# Patient Record
Sex: Male | Born: 1995
Health system: Southern US, Community
[De-identification: ages and names within clinical notes are randomized; demographics above are authoritative.]

## PROBLEM LIST (undated history)

## (undated) DIAGNOSIS — H9190 Unspecified hearing loss, unspecified ear: Secondary | ICD-10-CM

## (undated) DIAGNOSIS — IMO0001 Reserved for inherently not codable concepts without codable children: Secondary | ICD-10-CM

## (undated) HISTORY — PX: APPENDECTOMY: SHX54

---
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
## 2015-06-20 DIAGNOSIS — K529 Noninfective gastroenteritis and colitis, unspecified: Secondary | ICD-10-CM | POA: Diagnosis not present

## 2015-08-13 DIAGNOSIS — W240XXA Contact with lifting devices, not elsewhere classified, initial encounter: Secondary | ICD-10-CM | POA: Diagnosis not present

## 2015-08-13 DIAGNOSIS — H913 Deaf nonspeaking, not elsewhere classified: Secondary | ICD-10-CM | POA: Diagnosis not present

## 2015-08-13 DIAGNOSIS — S63501A Unspecified sprain of right wrist, initial encounter: Secondary | ICD-10-CM | POA: Diagnosis not present

## 2015-08-13 DIAGNOSIS — Y93F2 Activity, caregiving, lifting: Secondary | ICD-10-CM | POA: Diagnosis not present

## 2015-08-13 DIAGNOSIS — Z87891 Personal history of nicotine dependence: Secondary | ICD-10-CM | POA: Diagnosis not present

## 2015-08-13 DIAGNOSIS — S6991XA Unspecified injury of right wrist, hand and finger(s), initial encounter: Secondary | ICD-10-CM | POA: Diagnosis not present

## 2015-08-13 DIAGNOSIS — S66911A Strain of unspecified muscle, fascia and tendon at wrist and hand level, right hand, initial encounter: Secondary | ICD-10-CM | POA: Diagnosis not present

## 2015-10-11 ENCOUNTER — Emergency Department (INDEPENDENT_AMBULATORY_CARE_PROVIDER_SITE_OTHER)
Admission: EM | Admit: 2015-10-11 | Discharge: 2015-10-11 | Disposition: A | Discharge status: Home / Self Care | Payer: Medicare Other | Source: Home / Self Care | Attending: Family Medicine | Admitting: Family Medicine

## 2015-10-11 ENCOUNTER — Encounter: Payer: Self-pay | Admitting: *Deleted

## 2015-10-11 DIAGNOSIS — K0889 Other specified disorders of teeth and supporting structures: Secondary | ICD-10-CM | POA: Diagnosis not present

## 2015-10-11 MED ORDER — AMOXICILLIN-POT CLAVULANATE 875-125 MG PO TABS: 1.0000 | ORAL_TABLET | Freq: Two times a day (BID) | ORAL | Status: DC

## 2015-10-11 MED ORDER — HYDROCODONE-ACETAMINOPHEN 5-325 MG PO TABS: 1.0000 | ORAL_TABLET | Freq: Four times a day (QID) | ORAL | Status: DC | PRN

## 2015-10-11 MED ORDER — ACETAMINOPHEN 325 MG PO TABS
650.0000 mg | ORAL_TABLET | Freq: Once | ORAL | Status: AC
  Administered 2015-10-11: 650 mg via ORAL

## 2015-10-11 NOTE — Discharge Instructions (Signed)
May take Ibuprofen 800mg , 1 tab every 8 hours with food.  If symptoms become significantly worse during the night or over the weekend, proceed to the local emergency room.  Try warm salt water gargles

## 2015-10-11 NOTE — ED Notes (Signed)
Patient is hearing impaired, offered interpretor during triage. Patient agreed he'd like to use one. Leory PlowmanNana Called for interpretor @ 11:10am

## 2015-10-11 NOTE — ED Notes (Signed)
Interpreter used for triage. Pt c/o 4 days of right sided HA, sinus pain, oral swelling and jaw pain. Taken 800mg  IBF without relief.

## 2015-10-11 NOTE — ED Provider Notes (Signed)
CSN: [id number]     Arrival date & time 10/11/15  1054 History   First MD Initiated Contact with Patient 10/11/15 1056     Chief Complaint  Patient presents with  . Headache  . Oral Swelling      Patient is a 19 y.o. male presenting with tooth pain. The history is provided by the patient. The history is limited by a language barrier. A language interpreter was used.  Dental Pain Location:  Lower Lower teeth location:  32/RL 3rd molar Quality:  Aching Severity:  Moderate Onset quality:  Sudden Duration:  4 days Timing:  Constant Progression:  Worsening Chronicity:  New Relieved by:  Nothing Worsened by:  Jaw movement, pressure and cold food/drink Ineffective treatments:  NSAIDs Associated symptoms: gum swelling and headaches   Associated symptoms: no congestion, no difficulty swallowing, no drooling, no facial pain, no facial swelling, no fever, no neck pain, no neck swelling, no oral bleeding, no oral lesions and no trismus     History reviewed. No pertinent past medical history. Past Surgical History  Procedure Laterality Date  . Appendectomy     Family History  Problem Relation Age of Onset  . Adopted: Yes   Social History  Substance Use Topics  . Smoking status: Never Smoker   . Smokeless tobacco: Never Used  . Alcohol Use: No    Review of Systems  Constitutional: Negative for fever.  HENT: Negative for congestion, drooling, facial swelling and mouth sores.   Musculoskeletal: Negative for neck pain.  Neurological: Positive for headaches.  All other systems reviewed and are negative.   Allergies  Review of patient's allergies indicates not on file.  Home Medications   Prior to Admission medications   Medication Sig Start Date End Date Taking? Authorizing Provider  amoxicillin-clavulanate (AUGMENTIN) 875-125 MG tablet Take 1 tablet by mouth 2 (two) times daily. Take with food 10/11/15   Lattie Haw, MD  HYDROcodone-acetaminophen (NORCO/VICODIN) 5-325 MG  tablet Take 1 tablet by mouth every 6 (six) hours as needed for moderate pain. 10/11/15   Lattie Haw, MD   Meds Ordered and Administered this Visit  Medications - No data to display  BP 121/75 mmHg  Pulse 51  Temp(Src) 98 F (36.7 C) (Oral)  Resp 16  Wt 180 lb (81.647 kg)  SpO2 99% No data found.   Physical Exam  Constitutional: He is oriented to person, place, and time. He appears well-developed and well-nourished. No distress.  HENT:  Head: Normocephalic.  Right Ear: External ear normal.  Left Ear: External ear normal.  Mouth/Throat: Uvula is midline and oropharynx is clear and moist. No trismus in the jaw. Abnormal dentition.    Right "wisdom" tooth partly impacted with surrounding gingival swollen and tendern to palpation.  No fluctuance noted.  There is overlying tenderness to palpation of the right cheek  Eyes: Conjunctivae and EOM are normal. Pupils are equal, round, and reactive to light.  Neck: Neck supple.  Cardiovascular: Normal heart sounds.   Pulmonary/Chest: Breath sounds normal.  Lymphadenopathy:    He has no cervical adenopathy.  Neurological: He is alert and oriented to person, place, and time.  Skin: Skin is warm and dry. No rash noted.  Nursing note and vitals reviewed.   ED Course  Procedures  none  MDM   1. Pain, dental    Begin Augmentin.  Rx for Lortab May take Ibuprofen , 1 tab every 8 hours with food.  If symptoms become significantly worse during  the night or over the weekend, proceed to the local emergency room.  Try warm salt water gargles Followup with dentist within one week.    Lattie HawStephen A Yoshiye Kraft, MD 10/17/15 [phone]

## 2015-10-29 ENCOUNTER — Emergency Department (INDEPENDENT_AMBULATORY_CARE_PROVIDER_SITE_OTHER): Payer: Medicare Other

## 2015-10-29 ENCOUNTER — Encounter: Payer: Self-pay | Admitting: Emergency Medicine

## 2015-10-29 ENCOUNTER — Emergency Department (INDEPENDENT_AMBULATORY_CARE_PROVIDER_SITE_OTHER)
Admission: EM | Admit: 2015-10-29 | Discharge: 2015-10-29 | Disposition: A | Discharge status: Home / Self Care | Payer: Medicare Other | Source: Home / Self Care | Attending: Family Medicine | Admitting: Family Medicine

## 2015-10-29 DIAGNOSIS — S60211A Contusion of right wrist, initial encounter: Secondary | ICD-10-CM | POA: Diagnosis not present

## 2015-10-29 DIAGNOSIS — M25531 Pain in right wrist: Secondary | ICD-10-CM

## 2015-10-29 DIAGNOSIS — S6991XA Unspecified injury of right wrist, hand and finger(s), initial encounter: Secondary | ICD-10-CM | POA: Diagnosis not present

## 2015-10-29 HISTORY — DX: Reserved for inherently not codable concepts without codable children: IMO0001

## 2015-10-29 HISTORY — DX: Unspecified hearing loss, unspecified ear: H91.90

## 2015-10-29 MED ORDER — TRAMADOL HCL 50 MG PO TABS: 50.0000 mg | ORAL_TABLET | Freq: Four times a day (QID) | ORAL | Status: DC | PRN

## 2015-10-29 NOTE — ED Provider Notes (Signed)
CSN: [id number]     Arrival date & time 10/29/15  1331 History   First MD Initiated Contact with Patient 10/29/15 1341     Chief Complaint  Patient presents with  . Wrist Pain   (Consider location/radiation/quality/duration/timing/severity/associated sxs/prior Treatment) Patient is a 19 y.o. male presenting with wrist pain. The history is provided by the patient. A language interpreter was used (ASL).  Wrist Pain  Pt is a 19yo male [facility] with [facility]/o persistent Right wrist pain that started about 2 months ago after he injured his wrist lifting a heavy box at work.  [facility] and had imaging done. The x-ray showed no fracture or dislocation, he was given a splint to treat a wrist sprain.  Pt states he has lost the wrist splint and pain has not improved.  Pt states he has continued to lift 50-60 pound boxes at work. No new injuries. He has been taking acetaminophen and ibuprofen with minimal to moderate relief, however, pain is worse at night. Pt is Right hand dominant.  Past Medical History  Diagnosis Date  . Hearing impaired person    Past Surgical History  Procedure Laterality Date  . Appendectomy     Family History  Problem Relation Age of Onset  . Adopted: Yes   Social History  Substance Use Topics  . Smoking status: Never Smoker   . Smokeless tobacco: Never Used  . Alcohol Use: No    Review of Systems  Musculoskeletal: Positive for myalgias and arthralgias. Negative for joint swelling.       Right wrist  Skin: Positive for color change. Negative for pallor, rash and wound.  Neurological: Positive for weakness. Negative for numbness.    Allergies  Review of patient's allergies indicates no known allergies.  Home Medications   Prior to Admission medications   Medication Sig Start Date End Date Taking? Authorizing Provider  amoxicillin-clavulanate (AUGMENTIN) 875-125 MG tablet Take 1 tablet by mouth 2 (two) times daily. Take with  food 10/11/15   Lattie HawStephen A Beese, MD  HYDROcodone-acetaminophen (NORCO/VICODIN) 5-325 MG tablet Take 1 tablet by mouth every 6 (six) hours as needed for moderate pain. 10/11/15   Lattie HawStephen A Beese, MD  traMADol (ULTRAM) 50 MG tablet Take 1 tablet (50 mg total) by mouth every 6 (six) hours as needed. 10/29/15   Junius FinnerErin O'Malley, PA-C   Meds Ordered and Administered this Visit  Medications - No data to display  BP 99/60 mmHg  Pulse 56  Temp(Src) 98.1 F (36.7 C) (Oral)  Resp 16  Ht 5\' 11"  (1.803 m)  Wt 185 lb (83.915 kg)  BMI 25.81 kg/m2  SpO2 95% No data found.   Physical Exam  Constitutional: He is oriented to person, place, and time. He appears well-developed and well-nourished.  HENT:  Head: Normocephalic and atraumatic.  Eyes: EOM are normal.  Neck: Normal range of motion.  Cardiovascular: Normal rate.   Pulses:      Radial pulses are 2+ on the right side.  Right hand: cap refill < 3 seconds  Pulmonary/Chest: Effort normal.  Musculoskeletal: Normal range of motion. He exhibits tenderness. He exhibits no edema.  Right wrist: no edema. Tenderness to dorsal aspect. No deformity noted. Full ROM. Increased pain with full extension and extension against resistance.   Full ROM all fingers. 4/5 grip strength Right hand compared to Left. No snuffbox tenderness.   Neurological: He is alert and oriented to person, place, and time.  Right hand:  normal sensation  Skin: Skin is warm and dry.  Right wrist, dorsal aspect: mild ecchymosis. No erythema or warmth.  Skin in tact.   Psychiatric: He has a normal mood and affect. His behavior is normal.  Nursing note and vitals reviewed.   ED Course  Procedures (including critical care time)  Labs Review Labs Reviewed - No data to display  Imaging Review Dg Wrist Complete Right  10/29/2015  CLINICAL DATA:  Right wrist pain for 3 months. Lifting injury at work. EXAM: RIGHT WRIST - COMPLETE 3+ VIEW COMPARISON:  None. FINDINGS: No acute fracture  or dislocation. Scaphoid intact. Joint spaces maintained. IMPRESSION: No acute osseous abnormality. Electronically Signed   By: Jeronimo Greaves M.D.   On: 10/29/2015 14:28      MDM   1. Right wrist pain   2. Superficial bruising of wrist, right, initial encounter    Pt is a 19yo male c/o 2-3 month hx of Right wrist pain.  Heavy lifting at work.  There is mild ecchymosis at dorsal aspect of wrist where pain is.    Plain films: no bony abnormality  No evidence of underlying infection Pain likely due to an overuse injury, or potential ganglion cyst, however, no nodule/deformity noted on exam  New wrist splint provided to pt. Work note for 7 days of light duty, no lifting >25lbs. F/u with Dr. Denyse Amass, Sports Medicine, for further evaluation and treatment of continued wrist pain. Patient expressed understanding and agreement with treatment plan.  ASL interpreter used throughout entire UC visit.    Junius Finner, PA-C 10/29/15 1529

## 2015-10-29 NOTE — Discharge Instructions (Signed)
Please call to schedule appointment with Dr. Denyse Amassorey, Sports Medicine, for further evaluation and treatment of Right wrist pain.  They may be able to use ultrasound and do joint injections to help with pain, or they may order different imaging such as an MRI to help find the source of the pain.

## 2015-10-29 NOTE — ED Notes (Signed)
Patient interviewed with ASL Interpreter present:  States he injured right wrist in 08-2015; xray was done and no fracture seen; given wrist brace; pain has not resolved and is particularly intense when at work lifting heavy boxes, and again at night when trying to sleep. Takes ibuprofen or tylenol for pain.

## 2015-11-02 ENCOUNTER — Telehealth: Payer: Self-pay | Admitting: Emergency Medicine

## 2016-01-02 ENCOUNTER — Encounter: Payer: Self-pay | Admitting: Emergency Medicine

## 2016-01-02 ENCOUNTER — Emergency Department (INDEPENDENT_AMBULATORY_CARE_PROVIDER_SITE_OTHER)
Admission: EM | Admit: 2016-01-02 | Discharge: 2016-01-02 | Disposition: A | Discharge status: Home / Self Care | Payer: Medicare Other | Source: Home / Self Care | Attending: Family Medicine | Admitting: Family Medicine

## 2016-01-02 DIAGNOSIS — J069 Acute upper respiratory infection, unspecified: Secondary | ICD-10-CM

## 2016-01-02 LAB — POCT RAPID STREP A (OFFICE): Rapid Strep A Screen: NEGATIVE

## 2016-01-02 MED ORDER — FLUTICASONE PROPIONATE 50 MCG/ACT NA SUSP: 2.0000 | Freq: Every day | NASAL | Status: DC

## 2016-01-02 MED ORDER — DOXYCYCLINE HYCLATE 100 MG PO CAPS: 100.0000 mg | ORAL_CAPSULE | Freq: Two times a day (BID) | ORAL | Status: DC

## 2016-01-02 MED ORDER — BENZONATATE 100 MG PO CAPS: 100.0000 mg | ORAL_CAPSULE | Freq: Three times a day (TID) | ORAL | Status: DC

## 2016-01-02 MED ORDER — PREDNISONE 20 MG PO TABS: ORAL_TABLET | ORAL | Status: DC

## 2016-01-02 MED ORDER — PSEUDOEPHEDRINE HCL 60 MG PO TABS: 60.0000 mg | ORAL_TABLET | Freq: Four times a day (QID) | ORAL | Status: DC | PRN

## 2016-01-02 NOTE — ED Notes (Signed)
Sore throat, dry chest, hurts in chest, dry cough, headache, chills, fever x 4 days

## 2016-01-02 NOTE — Discharge Instructions (Signed)
You may take 400-600mg  Ibuprofen (Motrin) every 6-8 hours for fever and pain  Alternate with Tylenol  You may take  Tylenol every 4-6 hours as needed for fever and pain  Follow-up with your primary care provider next week for recheck of symptoms if not improving.  Be sure to drink plenty of fluids and rest, at least 8hrs of sleep a night, preferably more while you are sick. Return urgent care or go to closest ER if you cannot keep down fluids/signs of dehydration, fever not reducing with Tylenol, difficulty breathing/wheezing, stiff neck, worsening condition, or other concerns (see below)   Your symptoms are likely due to a virus such as the common cold, however, if you developing worsening chest congestion with shortness of breath, persistent fever for 3 days, or symptoms not improving in 4-5 days, you may fill the antibiotic (Doxycycline).  If you do fill the antibiotic,  please take antibiotics as prescribed and be sure to complete entire course even if you start to feel better to ensure infection does not come back.   Cool Mist Vaporizers Vaporizers may help relieve the symptoms of a cough and cold. They add moisture to the air, which helps mucus to become thinner and less sticky. This makes it easier to breathe and cough up secretions. Cool mist vaporizers do not cause serious burns like hot mist vaporizers, which may also be called steamers or humidifiers. Vaporizers have not been proven to help with colds. You should not use a vaporizer if you are allergic to mold. HOME CARE INSTRUCTIONS  Follow the package instructions for the vaporizer.  Do not use anything other than distilled water in the vaporizer.  Do not run the vaporizer all of the time. This can cause mold or bacteria to grow in the vaporizer.  Clean the vaporizer after each time it is used.  Clean and dry the vaporizer well before storing it.  Stop using the vaporizer if worsening respiratory symptoms develop.   This  information is not intended to replace advice given to you by your health care provider. Make sure you discuss any questions you have with your health care provider.   Document Released: 07/16/2004 Document Revised: 10/24/2013 Document Reviewed: 03/08/2013 Elsevier Interactive Patient Education 2016 [employer] Inc[employer]  Upper Respiratory Infection, Adult Most upper respiratory infections (URIs) are caused by a virus. A URI affects the nose, throat, and upper air passages. The most common type of URI is often called "the common cold." HOME CARE   Take medicines only as told by your doctor.  Gargle warm saltwater or take cough drops to comfort your throat as told by your doctor.  Use a warm mist humidifier or inhale steam from a shower to increase air moisture. This may make it easier to breathe.  Drink enough fluid to keep your pee (urine) clear or pale yellow.  Eat soups and other clear broths.  Have a healthy diet.  Rest as needed.  Go back to work when your fever is gone or your doctor says it is okay.  You may need to stay home longer to avoid giving your URI to others.  You can also wear a face mask and wash your hands often to prevent spread of the virus.  Use your inhaler more if you have asthma.  Do not use any tobacco products, including cigarettes, chewing tobacco, or electronic cigarettes. If you need help quitting, ask your doctor. GET HELP IF:  You are getting worse, not better.  Your symptoms  are not helped by medicine.  You have chills.  You are getting more short of breath.  You have brown or red mucus.  You have yellow or brown discharge from your nose.  You have pain in your face, especially when you bend forward.  You have a fever.  You have puffy (swollen) neck glands.  You have pain while swallowing.  You have white areas in the back of your throat. GET HELP RIGHT AWAY IF:   You have very bad or constant:  Headache.  Ear pain.  Pain in  your forehead, behind your eyes, and over your cheekbones (sinus pain).  Chest pain.  You have long-lasting (chronic) lung disease and any of the following:  Wheezing.  Long-lasting cough.  Coughing up blood.  A change in your usual mucus.  You have a stiff neck.  You have changes in your:  Vision.  Hearing.  Thinking.  Mood. MAKE SURE YOU:   Understand these instructions.  Will watch your condition.  Will get help right away if you are not doing well or get worse.   This information is not intended to replace advice given to you by your health care provider. Make sure you discuss any questions you have with your health care provider.   Document Released: 04/06/2008 Document Revised: 03/05/2015 Document Reviewed: 01/24/2014 Elsevier Interactive Patient Education [employer]

## 2016-01-02 NOTE — ED Provider Notes (Signed)
CSN: [id number]     Arrival date & time 01/02/16  1122 History   First MD Initiated Contact with Patient 01/02/16 1239     Chief Complaint  Patient presents with  . Sore Throat   (Consider location/radiation/quality/duration/timing/severity/associated sxs/prior Treatment) Patient is a 20 y.o. male presenting with pharyngitis. The history is provided by the patient. The history is limited by a language barrier. A language interpreter was used (ASL interpreter).  Sore Throat Associated symptoms include chest pain and headaches. Pertinent negatives include no abdominal pain and no shortness of breath.   The pt is a 20yo male presenting to [facility] with c/o 4 days of persistent URI symptoms including mild intermittent dry cough, moderately sore throat that is worse with swallowing, sinus congestion, frontal headache and subjective fever with hot and cold chills. He has been taking OTC sinus medication as recommended by some friends but no relief. He has mild nausea but denies vomiting or diarrhea.   Past Medical History  Diagnosis Date  . Hearing impaired person    Past Surgical History  Procedure Laterality Date  . Appendectomy     Family History  Problem Relation Age of Onset  . Adopted: Yes   Social History  Substance Use Topics  . Smoking status: Never Smoker   . Smokeless tobacco: Never Used  . Alcohol Use: Yes    Review of Systems  Constitutional: Positive for fever and chills.  HENT: Positive for congestion. Negative for ear pain, sore throat, trouble swallowing and voice change.   Respiratory: Positive for cough and wheezing. Negative for shortness of breath.   Cardiovascular: Positive for chest pain. Negative for palpitations.  Gastrointestinal: Positive for nausea. Negative for vomiting, abdominal pain and diarrhea.  Musculoskeletal: Positive for myalgias and arthralgias. Negative for back pain.  Skin: Negative for rash.  Neurological: Positive for headaches. Negative for  dizziness and numbness.    Allergies  Review of patient's allergies indicates not on file.  Home Medications   Prior to Admission medications   Medication Sig Start Date End Date Taking? Authorizing Provider  amoxicillin-clavulanate (AUGMENTIN) 875-125 MG tablet Take 1 tablet by mouth 2 (two) times daily. Take with food 10/11/15   Lattie Haw, MD  benzonatate (TESSALON) 100 MG capsule Take 1 capsule (100 mg total) by mouth every 8 (eight) hours. 01/02/16   Junius Finner, PA-C  doxycycline (VIBRAMYCIN) 100 MG capsule Take 1 capsule (100 mg total) by mouth 2 (two) times daily. One po bid x 7 days 01/02/16   Junius Finner, PA-C  fluticasone [facility]) 50 MCG/ACT nasal spray Place 2 sprays into both nostrils daily. For at least 2 weeks, or seasonally as needed 01/02/16   Junius Finner, PA-C  HYDROcodone-acetaminophen (NORCO/VICODIN) 5-325 MG tablet Take 1 tablet by mouth every 6 (six) hours as needed for moderate pain. 10/11/15   Lattie Haw, MD  predniSONE (DELTASONE) 20 MG tablet 3 tabs po day one, then 2 po daily x 4 days 01/02/16   Junius Finner, PA-C  pseudoephedrine (SUDAFED) 60 MG tablet Take 1 tablet (60 mg total) by mouth every 6 (six) hours as needed. 01/02/16   Junius Finner, PA-C  traMADol (ULTRAM) 50 MG tablet Take 1 tablet (50 mg total) by mouth every 6 (six) hours as needed. 10/29/15   Junius Finner, PA-C   Meds Ordered and Administered this Visit  Medications - No data to display  BP 128/75 mmHg  Pulse 62  Temp(Src) 98.1 F (36.7 C) (Oral)  Wt 188 lb (  85.276 kg)  SpO2 100% No data found.   Physical Exam  Constitutional: He appears well-developed and well-nourished.  HENT:  Head: Normocephalic and atraumatic.  Right Ear: Tympanic membrane normal.  Left Ear: Tympanic membrane is scarred. A middle ear effusion is present.  Nose: Mucosal edema and rhinorrhea present. Right sinus exhibits no maxillary sinus tenderness and no frontal sinus tenderness. Left sinus exhibits  maxillary sinus tenderness. Left sinus exhibits no frontal sinus tenderness.  Mouth/Throat: Uvula is midline and mucous membranes are normal. Posterior oropharyngeal erythema present. No oropharyngeal exudate, posterior oropharyngeal edema or tonsillar abscesses.  Eyes: Conjunctivae are normal. No scleral icterus.  Neck: Normal range of motion. Neck supple.  Cardiovascular: Normal rate, regular rhythm and normal heart sounds.   Pulmonary/Chest: Effort normal and breath sounds normal. No stridor. No respiratory distress. He has no wheezes. He has no rales.  Abdominal: Soft. He exhibits no distension. There is no tenderness.  Musculoskeletal: Normal range of motion.  Lymphadenopathy:    He has no cervical adenopathy.  Neurological: He is alert.  Skin: Skin is warm and dry.  Nursing note and vitals reviewed.   ED Course  Procedures (including critical care time)  Labs Review Labs Reviewed  POCT RAPID STREP A (OFFICE)    Imaging Review No results found.     MDM   1. Acute upper respiratory infection    Pt c/o 4 days of URI symptoms.  Pt is out of window for benefit from Tamiflu, no flu test performed.  Rapid strep: negative  Symptoms likely viral. Encouraged symptomatic treatment. Rx: Tessalon, Flonase, and prednisone. May alternate acetaminophen and ibuprofen for fever and pain.  Prescription for Azithromycin provided to pt to hold (expiration date) may fill if symptoms not improving in 4-5 days or persistent fever develops.  F/u with PCP in 7-10 days if not improving.  Patient verbalized understanding and agreement with treatment plan.    Junius Finner, PA-C 01/02/16 1319

## 2016-04-12 DIAGNOSIS — S199XXA Unspecified injury of neck, initial encounter: Secondary | ICD-10-CM | POA: Diagnosis not present

## 2016-04-12 DIAGNOSIS — S0083XA Contusion of other part of head, initial encounter: Secondary | ICD-10-CM | POA: Diagnosis not present

## 2016-04-12 DIAGNOSIS — S069X9A Unspecified intracranial injury with loss of consciousness of unspecified duration, initial encounter: Secondary | ICD-10-CM | POA: Diagnosis not present

## 2016-07-15 ENCOUNTER — Emergency Department (INDEPENDENT_AMBULATORY_CARE_PROVIDER_SITE_OTHER)
Admission: EM | Admit: 2016-07-15 | Discharge: 2016-07-15 | Disposition: A | Discharge status: Home / Self Care | Payer: Medicare Other | Source: Home / Self Care | Attending: Family Medicine | Admitting: Family Medicine

## 2016-07-15 ENCOUNTER — Encounter: Payer: Self-pay | Admitting: Emergency Medicine

## 2016-07-15 DIAGNOSIS — R05 Cough: Secondary | ICD-10-CM

## 2016-07-15 DIAGNOSIS — R059 Cough, unspecified: Secondary | ICD-10-CM

## 2016-07-15 DIAGNOSIS — J069 Acute upper respiratory infection, unspecified: Secondary | ICD-10-CM | POA: Diagnosis not present

## 2016-07-15 LAB — POCT INFLUENZA A/B
Influenza A, POC: NEGATIVE
Influenza B, POC: NEGATIVE

## 2016-07-15 MED ORDER — BENZONATATE 100 MG PO CAPS: 100.0000 mg | ORAL_CAPSULE | Freq: Three times a day (TID) | ORAL | 0 refills | Status: DC

## 2016-07-15 MED ORDER — AZITHROMYCIN 250 MG PO TABS: 250.0000 mg | ORAL_TABLET | Freq: Every day | ORAL | 0 refills | Status: DC

## 2016-07-15 NOTE — ED Triage Notes (Signed)
Headache, runny nose, sore throat, body aches x 2 days

## 2016-07-15 NOTE — Discharge Instructions (Signed)
You may take 400-600mg Ibuprofen (Motrin) every 6-8 hours for fever and pain  °Alternate with Tylenol  °You may take 500mg Tylenol every 4-6 hours as needed for fever and pain  °Follow-up with your primary care provider next week for recheck of symptoms if not improving.  °Be sure to drink plenty of fluids and rest, at least 8hrs of sleep a night, preferably more while you are sick. °Return urgent care or go to closest ER if you cannot keep down fluids/signs of dehydration, fever not reducing with Tylenol, difficulty breathing/wheezing, stiff neck, worsening condition, or other concerns (see below)  ° °Your symptoms are likely due to a virus such as the common cold, however, if you developing worsening chest congestion with shortness of breath, persistent fever for 3 days, or symptoms not improving in 4-5 days, you may fill the antibiotic (azithromycin).  If you do fill the antibiotic,  please take antibiotics as prescribed and be sure to complete entire course even if you start to feel better to ensure infection does not come back. ° °

## 2016-07-15 NOTE — ED Provider Notes (Signed)
CSN: [id number]     Arrival date & time 07/15/16  1038 History   First MD Initiated Contact with Patient 07/15/16 1103     Chief Complaint  Patient presents with  . URI   (Consider location/radiation/quality/duration/timing/severity/associated sxs/prior Treatment) HPI:  Pt is hearing impaired but able to read lips. Declined official Chemical engineer.    Pt c/o 2 days of gradually worsening body aches, chills, nasal congestion, sore throat and headache. Mild intermittent cough.  He has taken OTC "pain reliever" with mild relief.  Subjective fever. Denies n/v/d. No sick contacts or recent travel. Denies chest pain or SOB. No hx of asthma.   Past Medical History:  Diagnosis Date  . Hearing impaired person    Past Surgical History:  Procedure Laterality Date  . APPENDECTOMY     Family History  Problem Relation Age of Onset  . Adopted: Yes   Social History  Substance Use Topics  . Smoking status: Current Every Day Smoker  . Smokeless tobacco: Never Used  . Alcohol use Yes    Review of Systems  Constitutional: Positive for fatigue and fever. Negative for appetite change.  HENT: Positive for congestion, postnasal drip, rhinorrhea, sinus pressure and sore throat. Negative for ear pain and sneezing.   Respiratory: Positive for cough. Negative for chest tightness, shortness of breath and wheezing.   Cardiovascular: Negative for chest pain and palpitations.  Gastrointestinal: Negative for abdominal pain, diarrhea, nausea and vomiting.  Musculoskeletal: Positive for arthralgias and myalgias.  Neurological: Positive for headaches. Negative for dizziness and light-headedness.    Allergies  Review of patient's allergies indicates no known allergies.  Home Medications   Prior to Admission medications   Medication Sig Start Date End Date Taking? Authorizing Provider  azithromycin (ZITHROMAX) 250 MG tablet Take 1 tablet (250 mg total) by mouth daily. Take first 2 tablets together, then  1 every day until finished. 07/15/16   Junius Finner, PA-C  benzonatate (TESSALON) 100 MG capsule Take 1-2 capsules (100-200 mg total) by mouth every 8 (eight) hours. 07/15/16   Junius Finner, PA-C  pseudoephedrine (SUDAFED) 60 MG tablet Take 1 tablet (60 mg total) by mouth every 6 (six) hours as needed. 01/02/16   Junius Finner, PA-C   Meds Ordered and Administered this Visit  Medications - No data to display  BP 118/80 (BP Location: Left Arm)   Pulse (!) 59   Temp 98.3 F (36.8 C) (Oral)   Ht 6' (1.829 m)   Wt 185 lb (83.9 kg)   SpO2 99%   BMI 25.09 kg/m  No data found.   Physical Exam  Constitutional: He appears well-developed and well-nourished. No distress.  HENT:  Head: Normocephalic and atraumatic.  Right Ear: Tympanic membrane normal.  Left Ear: Tympanic membrane normal.  Nose: Rhinorrhea present.  Mouth/Throat: Uvula is midline and mucous membranes are normal. Posterior oropharyngeal erythema present. No oropharyngeal exudate, posterior oropharyngeal edema or tonsillar abscesses.  Eyes: Conjunctivae are normal. No scleral icterus.  Neck: Normal range of motion. Neck supple.  Cardiovascular: Normal rate, regular rhythm and normal heart sounds.   Pulmonary/Chest: Effort normal and breath sounds normal. No respiratory distress. He has no wheezes. He has no rales. He exhibits no tenderness.  Abdominal: Soft. He exhibits no distension. There is no tenderness.  Musculoskeletal: Normal range of motion.  Neurological: He is alert.  Skin: Skin is warm and dry. He is not diaphoretic.  Nursing note and vitals reviewed.   Urgent Care Course   Clinical Course  Procedures (including critical care time)  Labs Review Labs Reviewed  POCT INFLUENZA A/B    Imaging Review No results found.    MDM   1. Acute upper respiratory infection   2. Cough    Pt c/o URI symptoms with body aches for 2 days.  Mild relief with OTC pain reliever.  Flu test- Negative  Symptoms  likely viral. Prescription for Tessalon provided. Prescription to hold with expiration date also provided to fill if symptoms not improving in 1 week or persistent fever develops.    Junius Finnerrin O'Malley, PA-C 07/15/16 1218

## 2016-08-02 ENCOUNTER — Emergency Department (INDEPENDENT_AMBULATORY_CARE_PROVIDER_SITE_OTHER)
Admission: EM | Admit: 2016-08-02 | Discharge: 2016-08-02 | Disposition: A | Discharge status: Home / Self Care | Payer: Medicare Other | Source: Home / Self Care | Attending: Family Medicine | Admitting: Family Medicine

## 2016-08-02 ENCOUNTER — Encounter: Payer: Self-pay | Admitting: Emergency Medicine

## 2016-08-02 DIAGNOSIS — J029 Acute pharyngitis, unspecified: Secondary | ICD-10-CM | POA: Diagnosis not present

## 2016-08-02 LAB — POCT CBC W AUTO DIFF (K'VILLE URGENT CARE)

## 2016-08-02 LAB — POCT MONO SCREEN (KUC): Mono, POC: NEGATIVE

## 2016-08-02 LAB — POCT RAPID STREP A (OFFICE): Rapid Strep A Screen: NEGATIVE

## 2016-08-02 MED ORDER — PENICILLIN V POTASSIUM 500 MG PO TABS: ORAL_TABLET | ORAL | 0 refills | Status: DC

## 2016-08-02 MED ORDER — PREDNISONE 20 MG PO TABS: 20.0000 mg | ORAL_TABLET | Freq: Two times a day (BID) | ORAL | 0 refills | Status: DC

## 2016-08-02 MED ORDER — IBUPROFEN 600 MG PO TABS
600.0000 mg | ORAL_TABLET | Freq: Once | ORAL | Status: AC
  Administered 2016-08-02: 600 mg via ORAL

## 2016-08-02 NOTE — Discharge Instructions (Addendum)
Try warm salt water gargles for sore throat.  May take Tylenol as needed for fever, headache, sore throat, etc.  °

## 2016-08-02 NOTE — ED Triage Notes (Signed)
Patient reports sore throat, body aches and extreme fatigue for past 3 days. No OTC today. Will use interpreter for detailed visit with Dr.Beese (ASL).

## 2016-08-03 NOTE — ED Provider Notes (Signed)
[facility]    CSN: [id number] Arrival date & time: 08/02/16  1650     History   Chief Complaint Chief Complaint  Patient presents with  . Sore Throat  . Generalized Body Aches  . Code Stroke    HPI Frank Ball is a 20 y.o. male.   Interview facilitated by sign language interpretor. Patient complains of onset of fatigue and myalgias four days ago.  He had an episode of diarrhea the next day, now resolved.  During the past two days he has developed a sore throat, the worst he has ever had.  He notes that he is able to swallow fluids.  He has had chills and headache, and remains extremely fatigued.   The history is provided by the patient. The history is limited by a language barrier. A language interpreter was used.    Past Medical History:  Diagnosis Date  . Hearing impaired person     There are no active problems to display for this patient.   Past Surgical History:  Procedure Laterality Date  . APPENDECTOMY         Home Medications    Prior to Admission medications   Medication Sig Start Date End Date Taking? Authorizing Provider  azithromycin (ZITHROMAX) 250 MG tablet Take 1 tablet (250 mg total) by mouth daily. Take first 2 tablets together, then 1 every day until finished. 07/15/16   Junius FinnerErin O'Malley, PA-C  benzonatate (TESSALON) 100 MG capsule Take 1-2 capsules (100-200 mg total) by mouth every 8 (eight) hours. 07/15/16   Junius FinnerErin O'Malley, PA-C  penicillin v potassium (VEETID) 500 MG tablet Take one tab by mouth twice daily for 10 days 08/02/16   Lattie HawStephen A Adarian Bur, MD  predniSONE (DELTASONE) 20 MG tablet Take 1 tablet (20 mg total) by mouth 2 (two) times daily. Take with food. 08/02/16   Lattie HawStephen A Gretel Cantu, MD  pseudoephedrine (SUDAFED) 60 MG tablet Take 1 tablet (60 mg total) by mouth every 6 (six) hours as needed. 01/02/16   Junius FinnerErin O'Malley, PA-C    Family History Family History  Problem Relation Age of Onset  . Adopted: Yes    Social History Social  History  Substance Use Topics  . Smoking status: Current Every Day Smoker  . Smokeless tobacco: Never Used  . Alcohol use Yes     Allergies   Review of patient's allergies indicates no known allergies.   Review of Systems Review of Systems + sore throat No cough No pleuritic pain No wheezing No nasal congestion No post-nasal drainage No sinus pain/pressure No itchy/red eyes No earache No hemoptysis No SOB ? fever, + chills + nausea + vomiting No abdominal pain + diarrhea No urinary symptoms No skin rash + fatigue + myalgias + headache Used OTC meds without relief   Physical Exam Triage Vital Signs ED Triage Vitals  Enc Vitals Group     BP 08/02/16 1817 113/72     Pulse Rate 08/02/16 1817 74     Resp 08/02/16 1817 16     Temp 08/02/16 1817 98.7 F (37.1 C)     Temp Source 08/02/16 1817 Oral     SpO2 08/02/16 1817 100 %     Weight 08/02/16 1818 180 lb (81.6 kg)     Height 08/02/16 1818 6' (1.829 m)     Head Circumference --      Peak Flow --      Pain Score 08/02/16 1820 2     Pain Loc --  Pain Edu? --      Excl. in GC? --    No data found.   Updated Vital Signs BP 113/72 (BP Location: Left Arm)   Pulse 74   Temp 98.7 F (37.1 C) (Oral)   Resp 16   Ht 6' (1.829 m)   Wt 180 lb (81.6 kg)   SpO2 100%   BMI 24.41 kg/m   Visual Acuity Right Eye Distance:   Left Eye Distance:   Bilateral Distance:    Right Eye Near:   Left Eye Near:    Bilateral Near:     Physical Exam Nursing notes and Vital Signs reviewed. Appearance:  Patient appears stated age, and in no acute distress Eyes:  Pupils are equal, round, and reactive to light and accomodation.  Extraocular movement is intact.  Conjunctivae are not inflamed  Ears:  Canals normal.  Tympanic membranes normal.  Nose:  Mildly congested turbinates.  No sinus tenderness.  Pharynx:  Erythematous bilaterally, including uvula; small amount of exudate present.  No trismus Neck:  Supple.  Tender  enlarged posterior/lateral nodes are palpated bilaterally, including tonsillar nodes.  Supraclavicular nodes are tender.  Lungs:  Clear to auscultation.  Breath sounds are equal.  Moving air well. Heart:  Regular rate and rhythm without murmurs, rubs, or gallops.  Abdomen:  Tenderness over both spleen and liver without masses or hepatosplenomegaly.  Bowel sounds are present.  No CVA or flank tenderness.  Extremities:  No edema.  Skin:  No rash present.    UC Treatments / Results  Labs (all labs ordered are listed, but only abnormal results are displayed) Labs Reviewed  STREP A DNA PROBE  EPSTEIN-BARR VIRUS EARLY D ANTIGEN ANTIBODY, IGG  EPSTEIN-BARR VIRUS NUCLEAR ANTIGEN ANTIBODY, IGG  EPSTEIN-BARR VIRUS VCA, IGG  EPSTEIN-BARR VIRUS VCA, IGM  POCT RAPID STREP A (OFFICE) negative POCT CBC:  WBC 10.0; LY 13.1; MO 5.4; GR 81.5; Hgb 16.0; Platelets 177     EKG  EKG Interpretation None       Radiology No results found.  Procedures Procedures (including critical care time)  Medications Ordered in UC Medications  ibuprofen (ADVIL,MOTRIN) tablet 600 mg (600 mg Oral Given 08/02/16 1849)     Initial Impression / Assessment and Plan / UC Course  I have reviewed the triage vital signs and the nursing notes.  Pertinent labs & imaging results that were available during my care of the patient were reviewed by me and considered in my medical decision making (see chart for details).  Clinical Course  ?false negative rapid strep test.   ?mono Throat culture pending.  EBV titers pending. Begin empiric Pen VK, and prednisone burst. Try warm salt water gargles for sore throat.  May take Tylenol as needed for fever, headache, sore throat, etc.  Return if not improved one week (does not have PCP)    Final Clinical Impressions(s) / UC Diagnoses   Final diagnoses:  Acute pharyngitis, unspecified etiology    New Prescriptions Discharge Medication List as of 08/02/2016  7:14 PM      START taking these medications   Details  penicillin v potassium (VEETID) 500 MG tablet Take one tab by mouth twice daily for 10 days, Print    predniSONE (DELTASONE) 20 MG tablet Take 1 tablet (20 mg total) by mouth 2 (two) times daily. Take with food., Starting Sun 08/02/2016, Print         Lattie Haw, MD 08/03/16 1640

## 2016-08-04 LAB — STREP A DNA PROBE: GASP: NOT DETECTED

## 2016-08-04 LAB — EPSTEIN-BARR VIRUS NUCLEAR ANTIGEN ANTIBODY, IGG

## 2016-08-04 LAB — EPSTEIN-BARR VIRUS VCA, IGG: EBV VCA IgG: 219 U/mL — ABNORMAL HIGH

## 2016-08-04 LAB — EPSTEIN-BARR VIRUS VCA, IGM: EBV VCA IgM: <36 U/mL

## 2016-08-04 LAB — EPSTEIN-BARR VIRUS EARLY D ANTIGEN ANTIBODY, IGG: EBV EA IgG: <9 U/mL

## 2016-08-05 ENCOUNTER — Telehealth: Payer: Self-pay | Admitting: Emergency Medicine

## 2016-08-05 ENCOUNTER — Telehealth (INDEPENDENT_AMBULATORY_CARE_PROVIDER_SITE_OTHER): Payer: Medicare Other | Admitting: *Deleted

## 2016-08-05 DIAGNOSIS — J029 Acute pharyngitis, unspecified: Secondary | ICD-10-CM | POA: Diagnosis not present

## 2016-08-05 NOTE — Telephone Encounter (Signed)
Encounter created to enter POCT mono order and result not entered on DOS.

## 2016-08-05 NOTE — Telephone Encounter (Signed)
Text reply from patient stating he is somewhat better. Given lab report results per Dr.Beese.

## 2016-08-06 DIAGNOSIS — J029 Acute pharyngitis, unspecified: Secondary | ICD-10-CM | POA: Diagnosis not present

## 2016-08-06 DIAGNOSIS — Z87891 Personal history of nicotine dependence: Secondary | ICD-10-CM | POA: Diagnosis not present

## 2016-08-06 DIAGNOSIS — R05 Cough: Secondary | ICD-10-CM | POA: Diagnosis not present

## 2016-08-06 NOTE — Telephone Encounter (Signed)
Added CBC order as well

## 2017-07-21 DIAGNOSIS — R300 Dysuria: Secondary | ICD-10-CM | POA: Diagnosis not present

## 2017-07-21 DIAGNOSIS — F17210 Nicotine dependence, cigarettes, uncomplicated: Secondary | ICD-10-CM | POA: Diagnosis not present

## 2017-07-21 DIAGNOSIS — N390 Urinary tract infection, site not specified: Secondary | ICD-10-CM | POA: Diagnosis not present

## 2017-07-28 IMAGING — CR DG WRIST COMPLETE 3+V*R*
4 series · 4 of 4 positions shown · non-contrast
Comparison: None.

CLINICAL DATA: Right wrist pain for 3 months. Lifting injury at
work.

EXAM:
RIGHT WRIST - COMPLETE 3+ VIEW

[wrist pa]
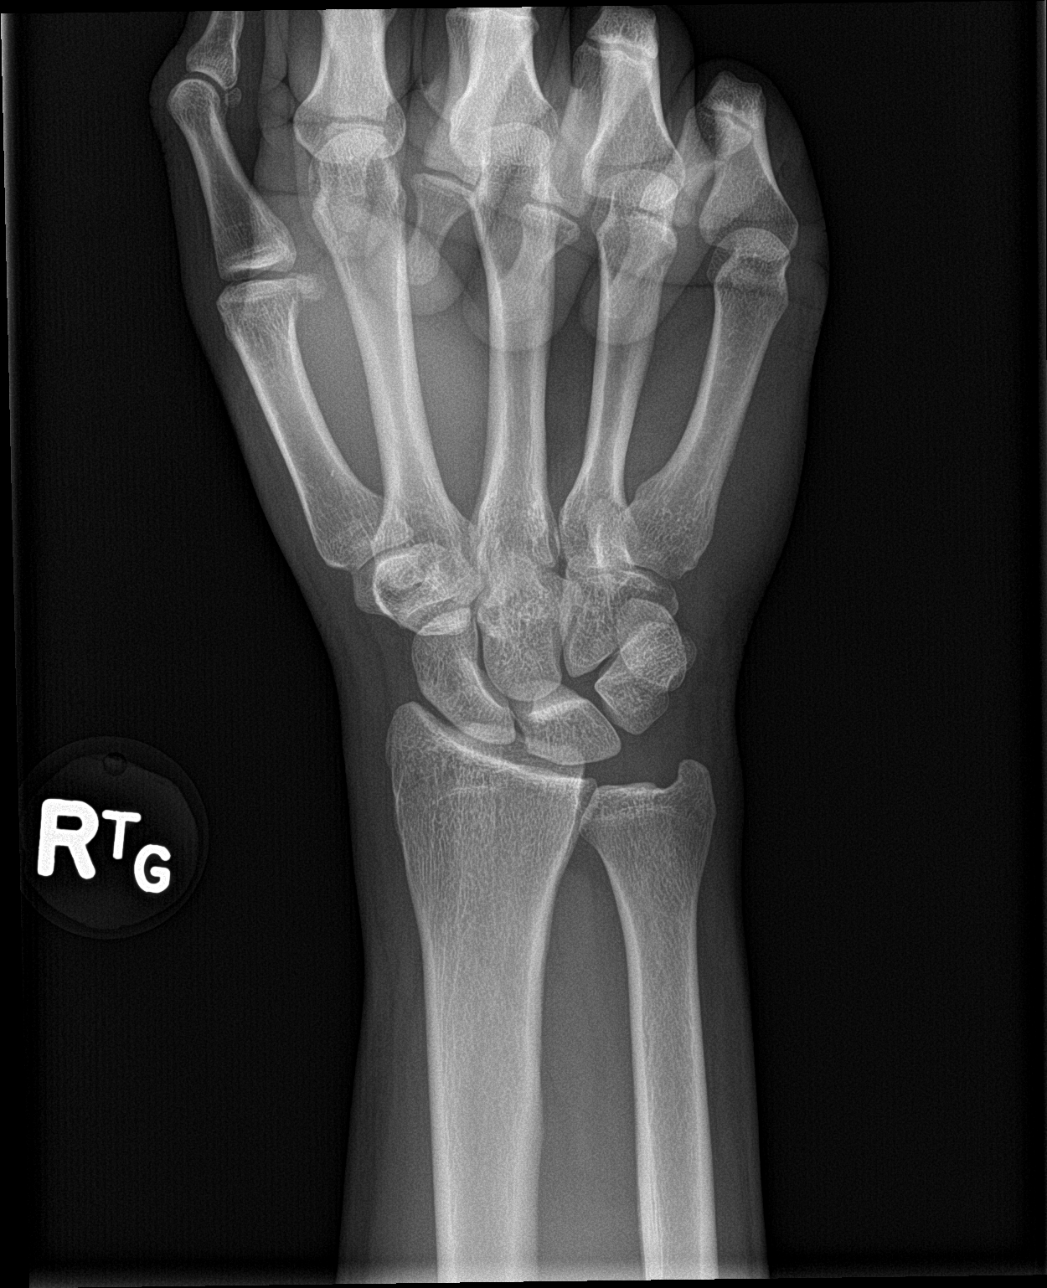

[wrist obl]
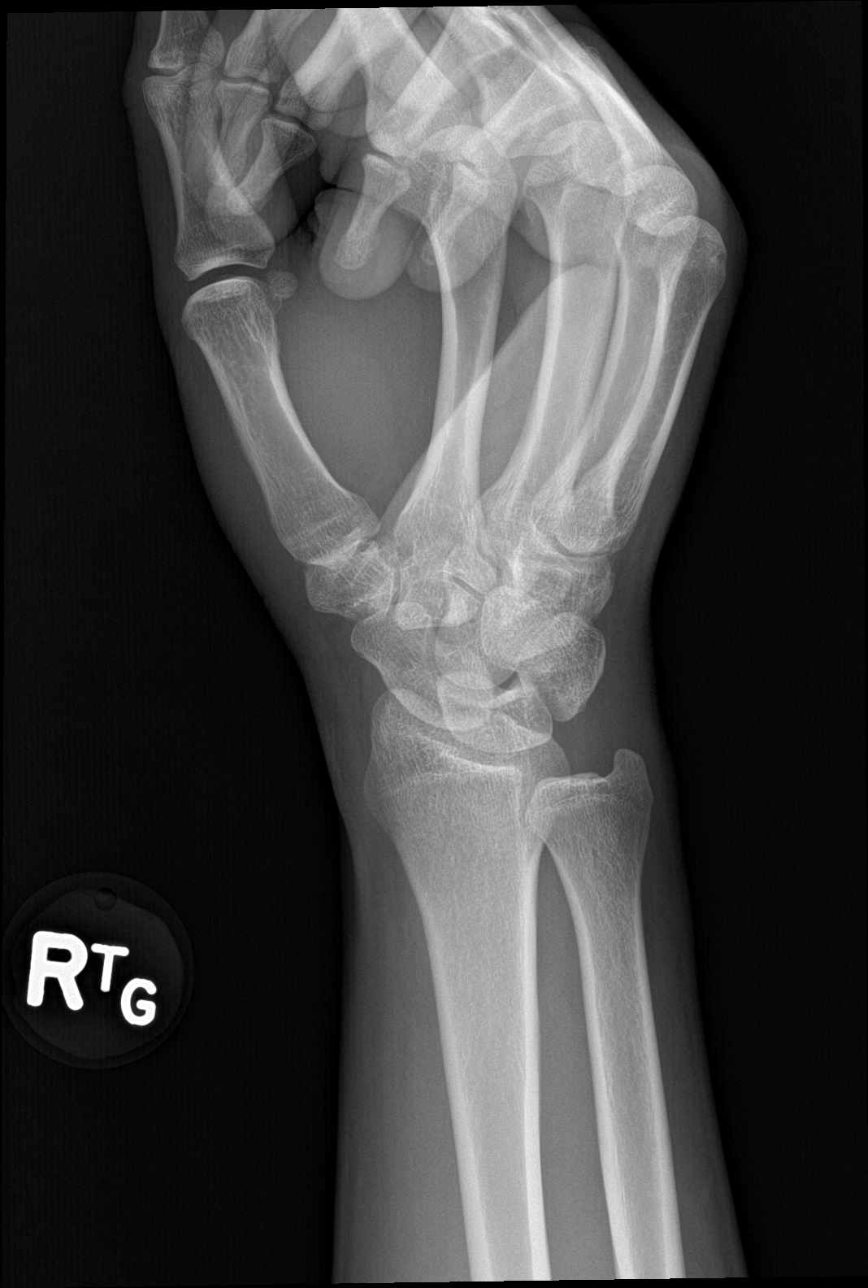

[wrist lat]
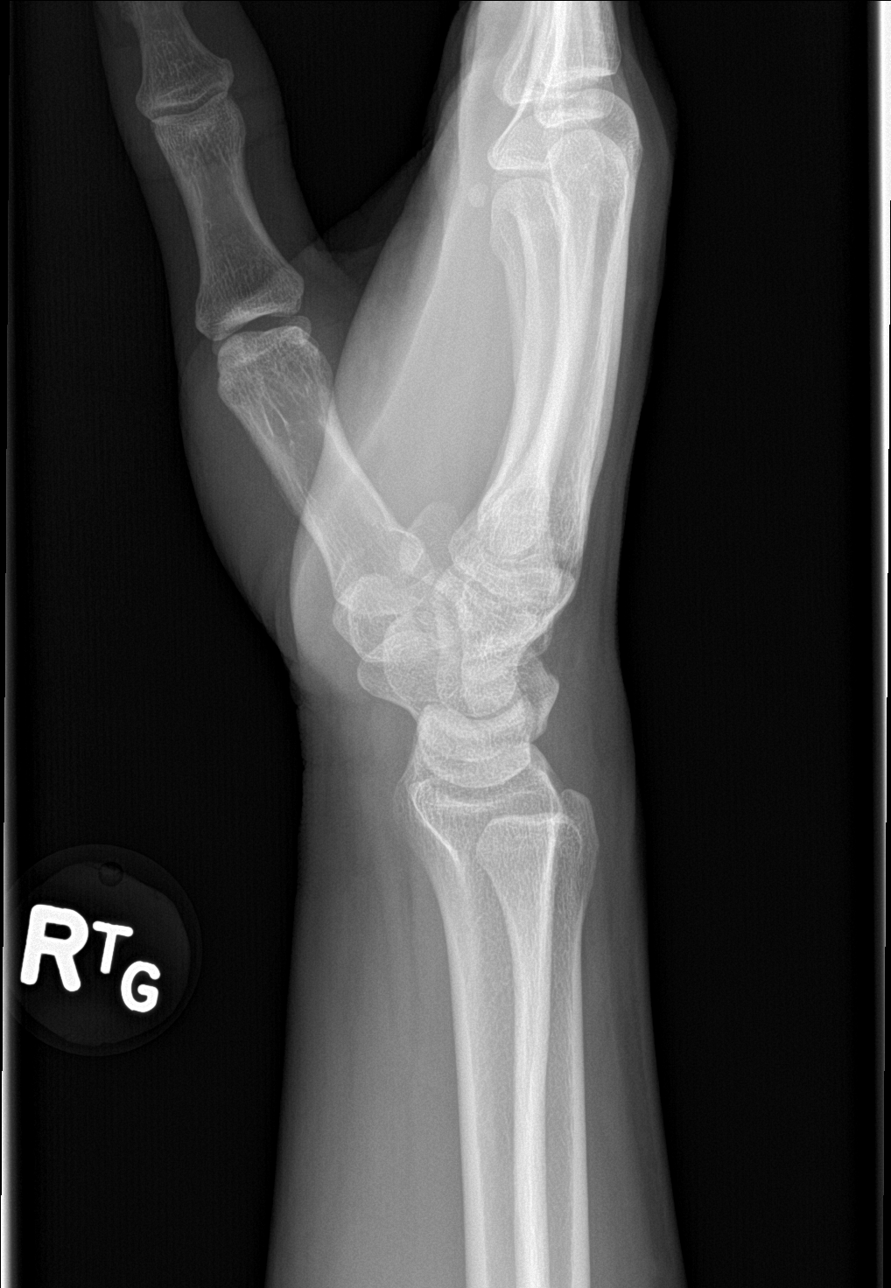

[wrist navicular]
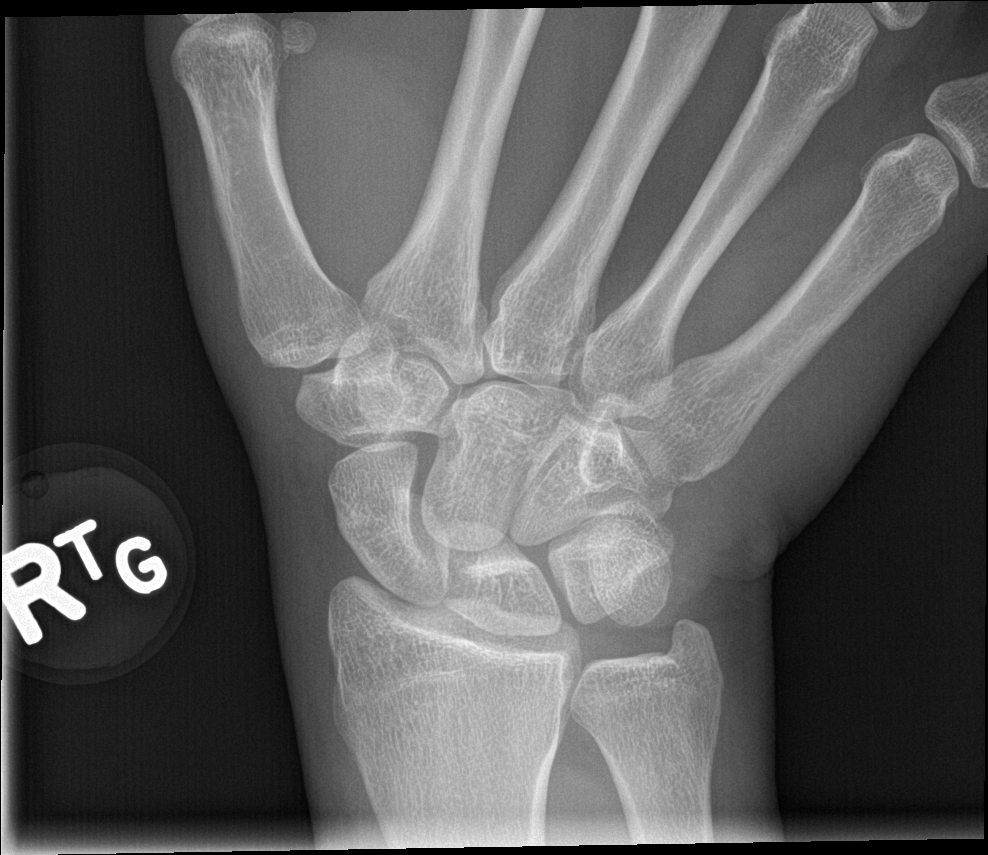

[4 of 4 positions shown; findings below may reference images not displayed]

FINDINGS: No acute fracture or dislocation. Scaphoid intact. Joint spaces
maintained.
IMPRESSION: No acute osseous abnormality.

## 2021-01-17 ENCOUNTER — Encounter: Payer: Self-pay | Admitting: Emergency Medicine

## 2021-01-17 ENCOUNTER — Other Ambulatory Visit: Payer: Self-pay

## 2021-01-17 ENCOUNTER — Emergency Department (INDEPENDENT_AMBULATORY_CARE_PROVIDER_SITE_OTHER)
Admission: EM | Admit: 2021-01-17 | Discharge: 2021-01-17 | Disposition: A | Discharge status: Home / Self Care | Payer: Medicaid Other | Source: Home / Self Care

## 2021-01-17 ENCOUNTER — Emergency Department: Admit: 2021-01-17 | Discharge status: Absent without leave | Payer: Self-pay | Source: Home / Self Care

## 2021-01-17 DIAGNOSIS — R197 Diarrhea, unspecified: Secondary | ICD-10-CM

## 2021-01-17 DIAGNOSIS — R112 Nausea with vomiting, unspecified: Secondary | ICD-10-CM

## 2021-01-17 DIAGNOSIS — A084 Viral intestinal infection, unspecified: Secondary | ICD-10-CM

## 2021-01-17 MED ORDER — DICYCLOMINE HCL 20 MG PO TABS: 20.0000 mg | ORAL_TABLET | Freq: Two times a day (BID) | ORAL | 0 refills | Status: DC

## 2021-01-17 MED ORDER — ONDANSETRON 4 MG PO TBDP: 4.0000 mg | ORAL_TABLET | Freq: Once | ORAL | Status: DC

## 2021-01-17 MED ORDER — ONDANSETRON HCL 4 MG PO TABS: 4.0000 mg | ORAL_TABLET | Freq: Four times a day (QID) | ORAL | 0 refills | Status: DC

## 2021-01-17 NOTE — ED Triage Notes (Signed)
Patient presents to Urgent Care with complaints of diarrhea, nausea, abdominal cramping starting yesterday. Patient reports having nausea, diarrhea. Having going about 6 times today. Having abdominal cramping. Never had zofran for nausea

## 2021-01-17 NOTE — Discharge Instructions (Addendum)
I have sent in Zofran for you to take one tablet every 8 hours as needed for nausea.  I have sent in dicyclomine for you to take for the abdominal cramping. You may take this twice a day as needed  Drink plenty of fluids. May get some gatorade or pedialyte, something with electrolytes to help replace your losses.  Follow up with this office or with primary care if symptoms are persisting.  Follow up in the ER for high fever, trouble swallowing, trouble breathing, other concerning symptoms.

## 2021-01-17 NOTE — ED Provider Notes (Signed)
[facility]   [id number] 01/17/21 Arrival Time: 1753  CC: ABDOMINAL PAIN  SUBJECTIVE:  Frank Ball is a 25 y.o. male who presents with fatigue, nausea, vomiting, diarrhea and abdominal cramping. Denies a precipitating event, trauma, close contacts with similar symptoms, recent travel or antibiotic use. Has taken ibuprofen and tums with no relief. Denies alleviating or aggravating factors. Denies similar symptoms in the past. Last BM today.    Denies fever, chills, weight changes, chest pain, SOB, constipation, hematochezia, melena, dysuria, difficulty urinating, increased frequency or urgency, flank pain, loss of bowel or bladder function ROS: As per HPI.  All other pertinent ROS negative.     Past Medical History:  Diagnosis Date  . Hearing impaired person    Past Surgical History:  Procedure Laterality Date  . APPENDECTOMY     No Known Allergies No current facility-administered medications on file prior to encounter.   Current Outpatient Medications on File Prior to Encounter  Medication Sig Dispense Refill  . azithromycin (ZITHROMAX) 250 MG tablet Take 1 tablet (250 mg total) by mouth daily. Take first 2 tablets together, then 1 every day until finished. 6 tablet 0  . benzonatate (TESSALON) 100 MG capsule Take 1-2 capsules (100-200 mg total) by mouth every 8 (eight) hours. 21 capsule 0  . penicillin v potassium (VEETID) 500 MG tablet Take one tab by mouth twice daily for 10 days 20 tablet 0  . predniSONE (DELTASONE) 20 MG tablet Take 1 tablet (20 mg total) by mouth 2 (two) times daily. Take with food. 10 tablet 0  . pseudoephedrine (SUDAFED) 60 MG tablet Take 1 tablet (60 mg total) by mouth every 6 (six) hours as needed. 30 tablet 0   Social History   Socioeconomic History  . Marital status: Single    Spouse name: Not on file  . Number of children: Not on file  . Years of education: Not on file  . Highest education level: Not on file  Occupational History  .  Not on file  Tobacco Use  . Smoking status: Current Every Day Smoker    Types: E-cigarettes  . Smokeless tobacco: Never Used  Vaping Use  . Vaping Use: Every day  . Devices: 1 year  Substance and Sexual Activity  . Alcohol use: Not Currently  . Drug use: No  . Sexual activity: Not on file  Other Topics Concern  . Not on file  Social History Narrative  . Not on file   Social Determinants of Health   Financial Resource Strain: Not on file  Food Insecurity: Not on file  Transportation Needs: Not on file  Physical Activity: Not on file  Stress: Not on file  Social Connections: Not on file  Intimate Partner Violence: Not on file   Family History  Adopted: Yes     OBJECTIVE:  Vitals:   01/17/21 1813  BP: 110/77  Pulse: 61  Resp: 16  TempSrc: Oral    General appearance: Alert; NAD HEENT: NCAT.  Oropharynx clear.  Lungs: clear to auscultation bilaterally without adventitious breath sounds Heart: regular rate and rhythm.  Radial pulses 2+ symmetrical bilaterally Abdomen: soft, non-distended; hyperactive bowel sounds; generalized abdominal tenderness with deep palpation; nontender at McBurney's point; negative Murphy's sign; negative rebound; no guarding Back: no CVA tenderness Extremities: no edema; symmetrical with no gross deformities Skin: warm and dry Neurologic: normal gait Psychological: alert and cooperative; normal mood and affect  LABS: No results found for this or any previous visit (from the  past 24 hour(s)).  DIAGNOSTIC STUDIES: No results found.   ASSESSMENT & PLAN:  1. Viral gastroenteritis   2. Nausea vomiting and diarrhea     Meds ordered this encounter  Medications  . ondansetron (ZOFRAN-ODT) disintegrating tablet 4 mg  . ondansetron (ZOFRAN) 4 MG tablet    Sig: Take 1 tablet (4 mg total) by mouth every 6 (six) hours.    Dispense:  12 tablet    Refill:  0    Order Specific Question:   Supervising Provider    Answer:   Merrilee Jansky  [id number]  . dicyclomine (BENTYL) 20 MG tablet    Sig: Take 1 tablet (20 mg total) by mouth 2 (two) times daily.    Dispense:  20 tablet    Refill:  0    Order Specific Question:   Supervising Provider    Answer:   Merrilee Jansky [id number]     Zofran given in office today Dicyclomine prescribed for abdominal cramping Zofran prescribed.   Take as directed.   Get rest and drink plenty of fluids DIET Instructions:  30 minutes after taking nausea medicine, begin with sips of clear liquids. If able to hold down 2 - 4 ounces for 30 minutes, begin drinking more. Increase your fluid intake to replace losses. Clear liquids only for 24 hours (water, tea, sport drinks, clear flat ginger ale or cola and juices, broth, jello, popsicles, ect). Advance to bland foods, applesauce, rice, baked or boiled chicken, ect. Avoid milk, greasy foods and anything that doesn't agree with you. Work note provided If you experience new or worsening symptoms return or go to ER such as fever, chills, nausea, vomiting, diarrhea, bloody or dark tarry stools, constipation, urinary symptoms, worsening abdominal discomfort, symptoms that do not improve with medications, inability to keep fluids down.  Reviewed expectations re: course of current medical issues. Questions answered. Outlined signs and symptoms indicating need for more acute intervention. Patient verbalized understanding. After Visit Summary given.   Moshe Cipro, NP 01/17/21 1836

## 2021-03-21 ENCOUNTER — Emergency Department: Admit: 2021-03-21 | Discharge status: Absent without leave | Payer: Self-pay

## 2021-03-21 ENCOUNTER — Emergency Department (INDEPENDENT_AMBULATORY_CARE_PROVIDER_SITE_OTHER)
Admission: EM | Admit: 2021-03-21 | Discharge: 2021-03-21 | Disposition: A | Discharge status: Home / Self Care | Payer: Medicaid Other | Source: Home / Self Care

## 2021-03-21 ENCOUNTER — Encounter: Payer: Self-pay | Admitting: Emergency Medicine

## 2021-03-21 DIAGNOSIS — E860 Dehydration: Secondary | ICD-10-CM

## 2021-03-21 DIAGNOSIS — R112 Nausea with vomiting, unspecified: Secondary | ICD-10-CM

## 2021-03-21 MED ORDER — ONDANSETRON 4 MG PO TBDP
4.0000 mg | ORAL_TABLET | Freq: Once | ORAL | Status: AC
  Administered 2021-03-21: 4 mg via ORAL

## 2021-03-21 MED ORDER — ONDANSETRON 4 MG PO TBDP: 4.0000 mg | ORAL_TABLET | Freq: Three times a day (TID) | ORAL | 0 refills | Status: AC | PRN

## 2021-03-21 NOTE — Discharge Instructions (Addendum)
Advised patient may use Zofran daily, as needed for nausea and vomiting.  Work note provided per patient's request.

## 2021-03-21 NOTE — ED Triage Notes (Addendum)
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

## 2021-03-21 NOTE — ED Provider Notes (Signed)
[facility]    CSN: [id number] Arrival date & time: 03/21/21  1816      History   Chief Complaint Chief Complaint  Patient presents with  . Emesis    HPI Frank Ball is a 25 y.o. male.   HPI 25 year old male presents with nausea and vomiting for 1 day.  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  Patient requests work note for today and tomorrow returning to work on Monday morning, 03/24/2021. Marcelino Duster [phone]) with [employer] will help translate this evening.  Past Medical History:  Diagnosis Date  . Hearing impaired person     There are no problems to display for this patient.   Past Surgical History:  Procedure Laterality Date  . APPENDECTOMY         Home Medications    Prior to Admission medications   Medication Sig Start Date End Date Taking? Authorizing Provider  ondansetron (ZOFRAN-ODT) 4 MG disintegrating tablet Take 1 tablet (4 mg total) by mouth every 8 (eight) hours as needed for nausea or vomiting. 03/21/21  Yes Trevor Iha, FNP  azithromycin (ZITHROMAX) 250 MG tablet Take 1 tablet (250 mg total) by mouth daily. Take first 2 tablets together, then 1 every day until finished. Patient not taking: Reported on 03/21/2021 07/15/16   Lurene Shadow, PA-C  benzonatate (TESSALON) 100 MG capsule Take 1-2 capsules (100-200 mg total) by mouth every 8 (eight) hours. Patient not taking: Reported on 03/21/2021 07/15/16   Lurene Shadow, PA-C  dicyclomine (BENTYL) 20 MG tablet Take 1 tablet (20 mg total) by mouth 2 (two) times daily. Patient not taking: Reported on 03/21/2021 01/17/21   Moshe Cipro, NP  ondansetron (ZOFRAN) 4 MG tablet Take 1 tablet (4 mg total) by mouth every 6 (six) hours. Patient not taking: Reported on 03/21/2021 01/17/21   Moshe Cipro, NP  penicillin v potassium (VEETID) 500 MG tablet Take one tab by mouth twice daily for 10 days Patient not  taking: Reported on 03/21/2021 08/02/16   Lattie Haw, MD  predniSONE (DELTASONE) 20 MG tablet Take 1 tablet (20 mg total) by mouth 2 (two) times daily. Take with food. Patient not taking: Reported on 03/21/2021 08/02/16   Lattie Haw, MD  pseudoephedrine (SUDAFED) 60 MG tablet Take 1 tablet (60 mg total) by mouth every 6 (six) hours as needed. Patient not taking: Reported on 03/21/2021 01/02/16   Rolla Plate    Family History Family History  Adopted: Yes  Family history unknown: Yes    Social History Social History   Tobacco Use  . Smoking status: Current Some Day Smoker    Types: E-cigarettes  . Smokeless tobacco: Never Used  Vaping Use  . Vaping Use: Some days  . Devices: 1 year  Substance Use Topics  . Alcohol use: Not Currently  . Drug use: No     Allergies   Patient has no known allergies.   Review of Systems Review of Systems  Constitutional: Negative.   HENT: Negative.   Eyes: Negative.   Respiratory: Negative.   Cardiovascular: Negative.   Gastrointestinal: Positive for nausea and vomiting.  Genitourinary: Negative.   Musculoskeletal: Negative.   Skin: Negative.   Neurological: Negative.      Physical Exam Triage Vital Signs ED Triage Vitals  Enc Vitals Group     BP 03/21/21 1836 102/64     Pulse Rate 03/21/21  1836 60     Resp 03/21/21 1836 16     Temp 03/21/21 1836 98.5 F (36.9 C)     Temp Source 03/21/21 1836 Oral     SpO2 03/21/21 1836 98 %     Weight --      Height --      Head Circumference --      Peak Flow --      Pain Score 03/21/21 1832 2     Pain Loc --      Pain Edu? --      Excl. in GC? --    No data found.  Updated Vital Signs BP 102/64 (BP Location: Right Arm)   Pulse 60   Temp 98.5 F (36.9 C) (Oral)   Resp 16   SpO2 98%   Physical Exam Vitals and nursing note reviewed.  Constitutional:      General: He is not in acute distress.    Appearance: Normal appearance. He is normal weight. He is not  ill-appearing.  HENT:     Head: Normocephalic and atraumatic.     Mouth/Throat:     Mouth: Mucous membranes are moist.     Pharynx: Oropharynx is clear.  Eyes:     Extraocular Movements: Extraocular movements intact.     Conjunctiva/sclera: Conjunctivae normal.     Pupils: Pupils are equal, round, and reactive to light.  Cardiovascular:     Rate and Rhythm: Normal rate and regular rhythm.     Pulses: Normal pulses.     Heart sounds: Normal heart sounds.  Pulmonary:     Effort: Pulmonary effort is normal.     Breath sounds: Normal breath sounds.     Comments: No adventitious breath sounds noted Abdominal:     General: Bowel sounds are normal. There is no distension.     Palpations: There is no mass.     Tenderness: There is no abdominal tenderness. There is no right CVA tenderness, left CVA tenderness, guarding or rebound.     Comments: No hepatosplenomegaly  Musculoskeletal:        General: Normal range of motion.  Skin:    General: Skin is warm and dry.  Neurological:     General: No focal deficit present.     Mental Status: He is alert and oriented to person, place, and time.  Psychiatric:        Mood and Affect: Mood normal.        Behavior: Behavior normal.      UC Treatments / Results  Labs (all labs ordered are listed, but only abnormal results are displayed) Labs Reviewed - No data to display  EKG   Radiology No results found.  Procedures Procedures (including critical care time)  Medications Ordered in UC Medications  ondansetron (ZOFRAN-ODT) disintegrating tablet 4 mg (4 mg Oral Given 03/21/21 1838)    Initial Impression / Assessment and Plan / UC Course  I have reviewed the triage vital signs and the nursing notes.  Pertinent labs & imaging results that were available during my care of the patient were reviewed by me and considered in my medical decision making (see chart for details).     MDM: 1. Dehydration, 2.  Nausea and vomiting in adult.   Patient discharged home, hemodynamically stable. Final Clinical Impressions(s) / UC Diagnoses   Final diagnoses:  Dehydration  Nausea and vomiting in adult     Discharge Instructions     Advised patient may use Zofran daily, as  needed for nausea and vomiting.  Work note provided per patient's request.    ED Prescriptions    Medication Sig Dispense Auth. Provider   ondansetron (ZOFRAN-ODT) 4 MG disintegrating tablet Take 1 tablet (4 mg total) by mouth every 8 (eight) hours as needed for nausea or vomiting. 20 tablet Trevor Iha, FNP     PDMP not reviewed this encounter.   Trevor Iha, FNP 03/21/21 1902

## 2021-10-20 ENCOUNTER — Emergency Department (INDEPENDENT_AMBULATORY_CARE_PROVIDER_SITE_OTHER)
Admission: EM | Admit: 2021-10-20 | Discharge: 2021-10-20 | Disposition: A | Discharge status: Home / Self Care | Payer: Medicaid Other | Source: Home / Self Care

## 2021-10-20 ENCOUNTER — Encounter: Payer: Self-pay | Admitting: Emergency Medicine

## 2021-10-20 ENCOUNTER — Emergency Department: Admit: 2021-10-20 | Discharge status: Absent without leave | Payer: Self-pay

## 2021-10-20 DIAGNOSIS — U071 COVID-19: Secondary | ICD-10-CM

## 2021-10-20 LAB — POC SARS CORONAVIRUS 2 AG -  ED: SARS Coronavirus 2 Ag: POSITIVE — AB

## 2021-10-20 LAB — POCT INFLUENZA A/B
Influenza A, POC: NEGATIVE
Influenza B, POC: NEGATIVE

## 2021-10-20 MED ORDER — BENZONATATE 100 MG PO CAPS: 100.0000 mg | ORAL_CAPSULE | Freq: Three times a day (TID) | ORAL | 0 refills | Status: DC

## 2021-10-20 NOTE — ED Provider Notes (Signed)
[facility]    CSN: [id number] Arrival date & time: 10/20/21  1314      History   Chief Complaint Chief Complaint  Patient presents with   Generalized Body Aches    HPI Jerimyah Vandunk is a 25 y.o. male.   Patient presents with concerns of possible COVID. He reports headache, body aches, fatigue, and mild cough for the past 2 days. He states his mother recently tested positive for COVID. He denies nasal congestion, sore throat, v/d. He took ibuprofen with mild improvement. He denies known fever but has been waking up with chills and sweats.  Sign language interpreter used Irving Burton [phone]).  The history is provided by the patient. A language interpreter was used.   Past Medical History:  Diagnosis Date   Hearing impaired person     There are no problems to display for this patient.   Past Surgical History:  Procedure Laterality Date   APPENDECTOMY         Home Medications    Prior to Admission medications   Medication Sig Start Date End Date Taking? Authorizing Provider  benzonatate (TESSALON) 100 MG capsule Take 1 capsule (100 mg total) by mouth every 8 (eight) hours. 10/20/21  Yes Jossie Smoot L, PA  ondansetron (ZOFRAN-ODT) 4 MG disintegrating tablet Take 1 tablet (4 mg total) by mouth every 8 (eight) hours as needed for nausea or vomiting. 03/21/21   Trevor Iha, FNP    Family History Family History  Adopted: Yes  Family history unknown: Yes    Social History Social History   Tobacco Use   Smoking status: Some Days    Types: E-cigarettes   Smokeless tobacco: Never  Vaping Use   Vaping Use: Some days   Devices: 1 year  Substance Use Topics   Alcohol use: Not Currently   Drug use: No     Allergies   Patient has no known allergies.   Review of Systems Review of Systems  Constitutional:  Positive for appetite change, chills, diaphoresis and fatigue. Negative for fever.  HENT:  Negative for congestion, ear pain and sore throat.    Respiratory:  Positive for cough. Negative for shortness of breath.   Gastrointestinal:  Positive for nausea. Negative for diarrhea and vomiting.  Musculoskeletal:  Positive for myalgias.  Skin:  Negative for rash.  Neurological:  Positive for headaches. Negative for dizziness.    Physical Exam Triage Vital Signs ED Triage Vitals  Enc Vitals Group     BP 10/20/21 1319 112/72     Pulse Rate 10/20/21 1319 89     Resp --      Temp 10/20/21 1319 98.5 F (36.9 C)     Temp Source 10/20/21 1319 Oral     SpO2 10/20/21 1319 97 %     Weight 10/20/21 1323 179 lb 14.3 oz (81.6 kg)     Height 10/20/21 1323 6' (1.829 m)     Head Circumference --      Peak Flow --      Pain Score 10/20/21 1322 6     Pain Loc --      Pain Edu? --      Excl. in GC? --    No data found.  Updated Vital Signs BP 112/72 (BP Location: Right Arm)    Pulse 89    Temp 98.5 F (36.9 C) (Oral)    Ht 6' (1.829 m)    Wt 179 lb 14.3 oz (81.6 kg)    SpO2  97%    BMI 24.40 kg/m   Visual Acuity Right Eye Distance:   Left Eye Distance:   Bilateral Distance:    Right Eye Near:   Left Eye Near:    Bilateral Near:     Physical Exam Vitals and nursing note reviewed.  Constitutional:      General: He is not in acute distress. HENT:     Head: Normocephalic.     Nose: Nose normal.     Mouth/Throat:     Mouth: Mucous membranes are moist.     Pharynx: Oropharynx is clear.  Eyes:     Conjunctiva/sclera: Conjunctivae normal.     Pupils: Pupils are equal, round, and reactive to light.  Cardiovascular:     Rate and Rhythm: Normal rate and regular rhythm.     Heart sounds: Normal heart sounds.  Pulmonary:     Effort: Pulmonary effort is normal.     Breath sounds: Normal breath sounds.  Skin:    Findings: No rash.  Neurological:     Mental Status: He is alert.     Gait: Gait normal.  Psychiatric:        Mood and Affect: Mood normal.     UC Treatments / Results  Labs (all labs ordered are listed, but only  abnormal results are displayed) Labs Reviewed  POC SARS CORONAVIRUS 2 AG -  ED - Abnormal; Notable for the following components:      Result Value   SARS Coronavirus 2 Ag Positive (*)    All other components within normal limits  POCT INFLUENZA A/B    EKG   Radiology No results found.  Procedures Procedures (including critical care time)  Medications Ordered in UC Medications - No data to display  Initial Impression / Assessment and Plan / UC Course  I have reviewed the triage vital signs and the nursing notes.  Pertinent labs & imaging results that were available during my care of the patient were reviewed by me and considered in my medical decision making (see chart for details).     Positive COVID. Stable, no red flags. Sx tx and reassurance. Isolation guidelines and return/ER precautions discussed.   E/M: 1 acute uncomplicated illness, 2 data (flu, covid), moderate risk due to prescription management  Final Clinical Impressions(s) / UC Diagnoses   Final diagnoses:  COVID     Discharge Instructions      Positive for COVID. Take cough medication as prescribed as needed. Continue with Tylenol and/or ibuprofen. Rest and keep hydrated. Self-isolate for first 5 days of symptoms then wear a mask around others for the next 5 days. Go to the ER if develop difficulty breathing.      ED Prescriptions     Medication Sig Dispense Auth. Provider   benzonatate (TESSALON) 100 MG capsule Take 1 capsule (100 mg total) by mouth every 8 (eight) hours. 21 capsule Vallery Sa, Walaa Carel L, PA      PDMP not reviewed this encounter.   Estanislado Pandy, [place] 10/20/21 1409

## 2021-10-20 NOTE — Discharge Instructions (Addendum)
Positive for COVID. Take cough medication as prescribed as needed. Continue with Tylenol and/or ibuprofen. Rest and keep hydrated. Self-isolate for first 5 days of symptoms then wear a mask around others for the next 5 days. Go to the ER if develop difficulty breathing.

## 2021-10-20 NOTE — ED Triage Notes (Signed)
Patient c/o body aches, fatigue, not feeling well, weakness, increased sleeping.  Patient is requesting a COVID test.  Patient taken Ibuprofen 800mg .  Patient does still have his taste and smell.

## 2021-10-21 ENCOUNTER — Telehealth: Payer: Self-pay | Admitting: Emergency Medicine

## 2021-10-21 MED ORDER — BENZONATATE 100 MG PO CAPS: 100.0000 mg | ORAL_CAPSULE | Freq: Three times a day (TID) | ORAL | 0 refills | Status: AC

## 2021-10-21 NOTE — Telephone Encounter (Signed)
Call to [facility] regarding missing tessalon perles cough medicine. Per Selena Batten medicine was not received by [facility] pharmacy on [address] in [place]. RN checked- confirmed receipt by pharmacy on 10/20/21 @ 1406.  Call back to pharmacy, RN spoke with pharmacist who confirmed the escript did not go thru on [facility] end. RN offered to do script via telephone. Pharmacist refused at this time due to current volume at pharmacy, per pharmacist "I am too busy right now". Chart reviewed with Dr Cathren Harsh. RN will send via original provider.

## 2021-10-21 NOTE — Telephone Encounter (Signed)
Call back to [facility] to let him know that tessalon perles had been called. E-script confirmed receipt at 6;53pm at [facility] on [address] in [place]
